# Patient Record
Sex: Female | Born: 1935 | Race: White | Hispanic: No | Smoking: Former smoker
Health system: Southern US, Community
[De-identification: ages and names within clinical notes are randomized; demographics above are authoritative.]

---

## 2006-09-07 ENCOUNTER — Ambulatory Visit (HOSPITAL_BASED_OUTPATIENT_CLINIC_OR_DEPARTMENT_OTHER): Admission: RE | Admit: 2006-09-07 | Discharge: 2006-09-07 | Payer: Self-pay | Admitting: Family Medicine

## 2006-09-09 ENCOUNTER — Ambulatory Visit: Payer: Self-pay | Admitting: Internal Medicine

## 2006-11-23 ENCOUNTER — Inpatient Hospital Stay (HOSPITAL_COMMUNITY): Admission: RE | Admit: 2006-11-23 | Discharge: 2006-11-25 | Payer: Self-pay | Admitting: Neurosurgery

## 2007-07-16 ENCOUNTER — Ambulatory Visit: Payer: Self-pay | Admitting: Orthopaedic Surgery

## 2009-02-15 ENCOUNTER — Ambulatory Visit: Payer: Self-pay | Admitting: Gastroenterology

## 2009-03-11 ENCOUNTER — Ambulatory Visit: Payer: Self-pay | Admitting: Gastroenterology

## 2009-05-26 ENCOUNTER — Encounter: Admission: RE | Admit: 2009-05-26 | Discharge: 2009-05-26 | Payer: Self-pay | Admitting: Neurosurgery

## 2009-05-26 IMAGING — RF IR MYELOGRAM [PERSON_NAME]
11 of 24 series · 11 of 24 positions shown · IV contrast (omnipaque)
Comparison: None available.

CLINICAL DATA: Cervical thoracic spondylosis.  Neck and upper
thoracic pain with radiation to both shoulders and the right hand.
Status post fusion at C5-6.

MYELOGRAM INJECTION
TECHNIQUE: Informed consent was obtained from the patient prior to
the procedure, including potential complications of headache,
allergy, infection and pain.  A timeout procedure was performed.
With the patient prone, the lower back was prepped with Betadine.
1% Lidocaine was used for local anesthesia.  Lumbar puncture was
performed at the right paramidline L2-3 level using a 22 gauge
needle with return of clear CSF.  10 ml of Omnipaque [UJ]
injected into the subarachnoid space .
TECHNIQUE: Following injection of intrathecal Omnipaque contrast,
spine imaging in multiple projections was performed using
fluoroscopy.
 Fluoroscopy Time: 3.09 minutes.
TECHNIQUE: CT imaging of the cervical spine was performed after
intrathecal contrast administration. Multiplanar CT image
reconstructions were also generated.
TECHNIQUE: CT imaging of the thoracic spine was performed after
intrathecal contrast administration.  Multiplanar CT image

[Series 2: myelogram  white · 1 of 1 slices shown (1 of 9)]
[im 1/1]
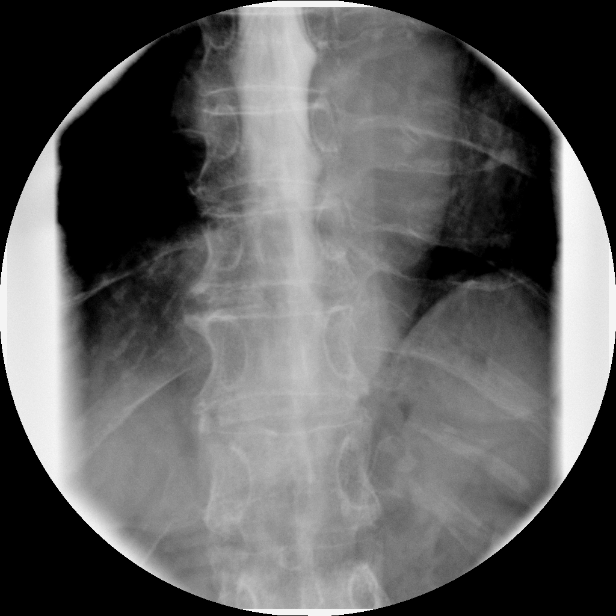

[Series 4: myelogram  white · 1 of 1 slices shown (2 of 9)]
[im 1/1]
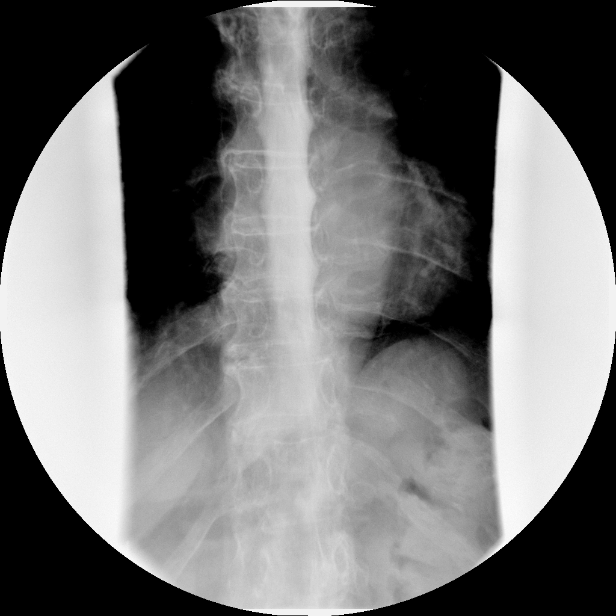

[Series 6: myelogram  white · 1 of 1 slices shown (3 of 9)]
[im 1/1]
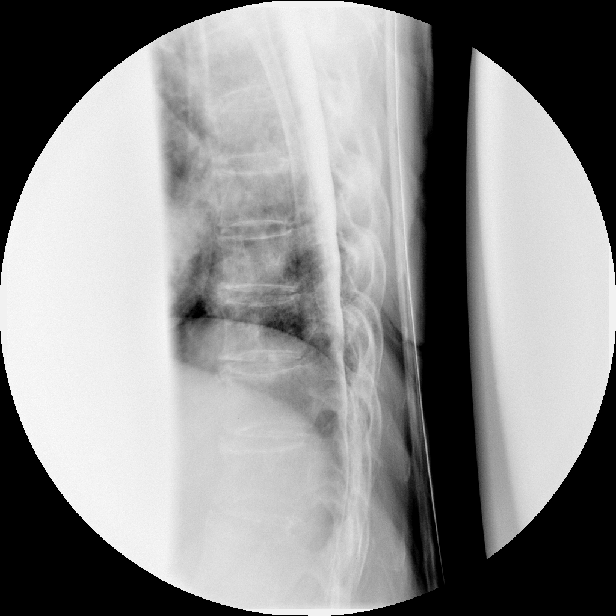

[Series 8: myelogram  white · 1 of 1 slices shown (4 of 9)]
[im 1/1]
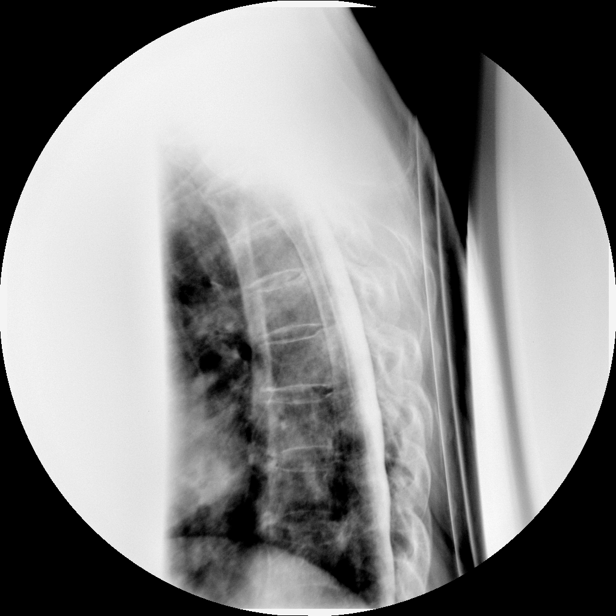

[Series 10: myelogram  white · 1 of 1 slices shown (5 of 9)]
[im 1/1]
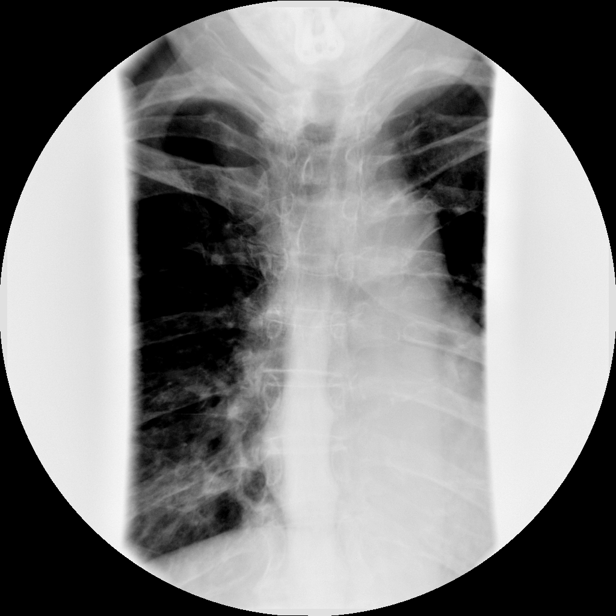

[Series 13: myelogram  white · 1 of 1 slices shown (6 of 9)]
[im 1/1]
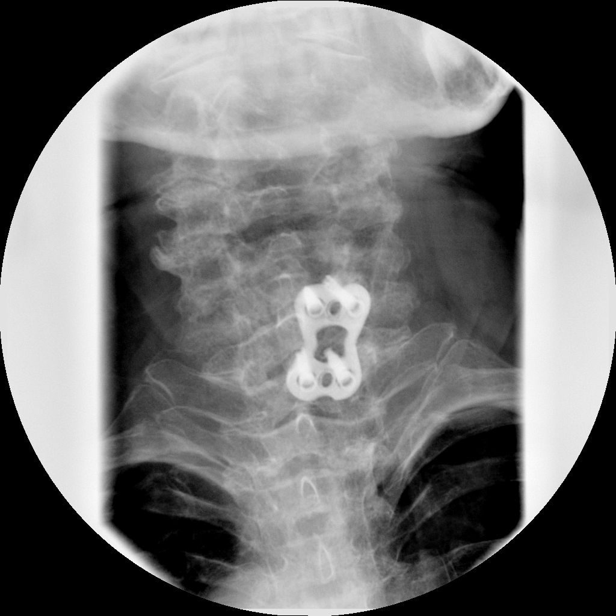

[Series 15: (hospital) · 1 of 1 slices shown (1 of 2)]
[im 1/1]
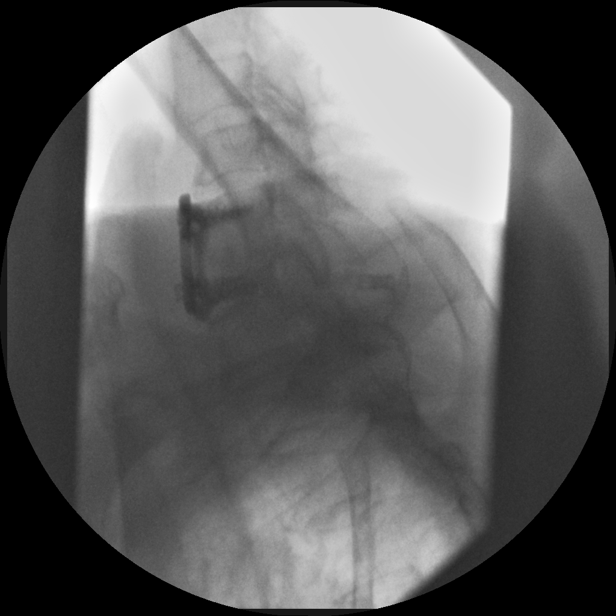

[Series 17: (hospital) · 1 of 1 slices shown (2 of 2)]
[im 1/1]
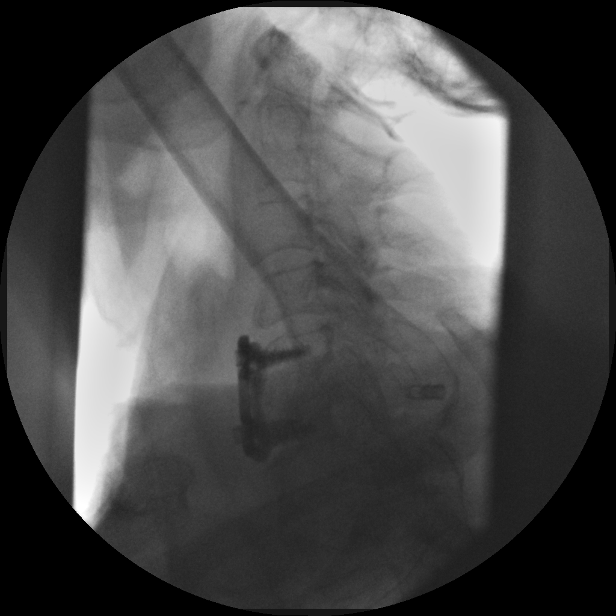

[Series 19: myelogram  white · 1 of 1 slices shown (7 of 9)]
[im 1/1]
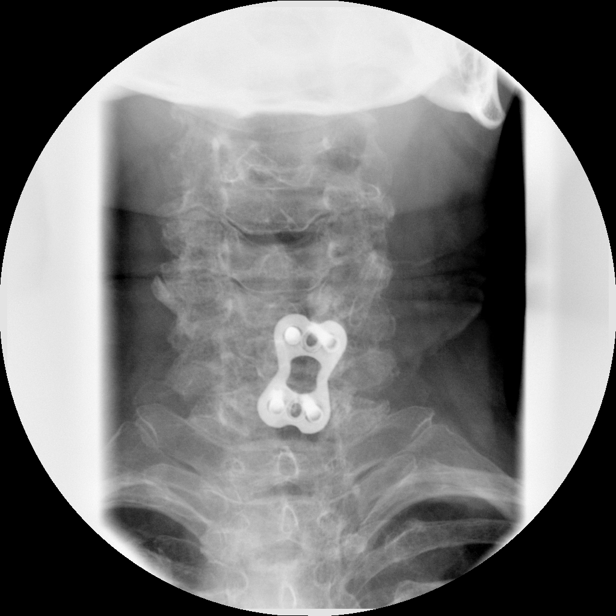

[Series 21: myelogram  white · 1 of 1 slices shown (8 of 9)]
[im 1/1]
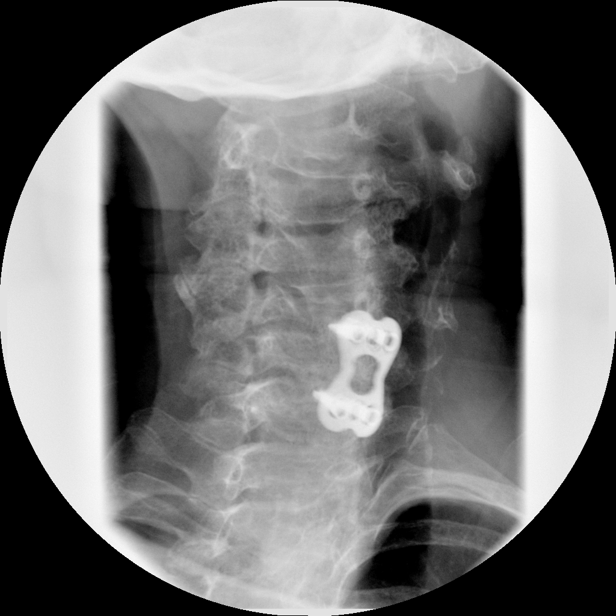

[Series 23: myelogram  white · 1 of 1 slices shown (9 of 9)]
[im 1/1]
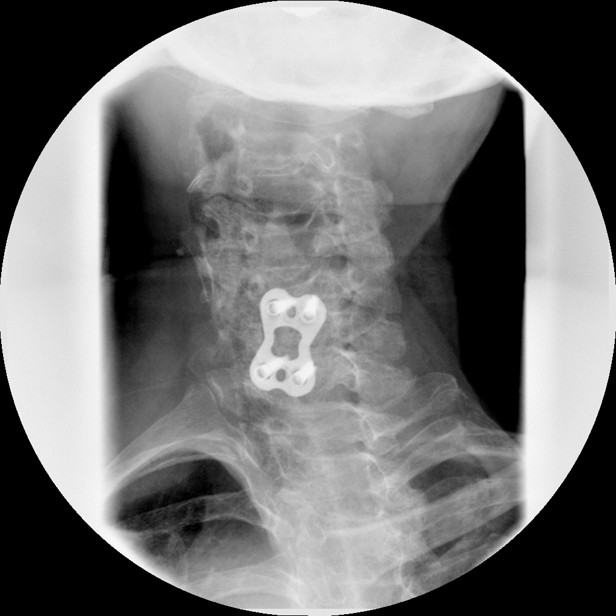

[11 of 24 positions shown; findings below may reference images not displayed]

IMPRESSION: Successful injection of  intrathecal contrast for myelography.

MYELOGRAM CERVICAL AND THORACIC
FINDINGS: Conventional myelographic images of the thoracic spine
demonstrate no focal disease.  The opacification of the cervical
spine is fairly faint.  The fusion at C5-6 appears mature.  A mild
disc osteophyte complex is present just above this level.  This is
seen better detail on the CT.
IMPRESSION: 1.  Small disc osteophyte complex at C4-5, just above the fused
level.
2.  The contrast is somewhat dilated to and the CT scan will be
utilized for the primary diagnosis in this case.

CT MYELOGRAPHY CERVICAL SPINE
FINDINGS: The patient is status post fusion at C5-6.  The right
inferior screw at C6 has some lucency about at.  Screw enters the
C6-7 disc space. Minimal degenerative anterolisthesis is present at
C4-5.  There is slight degenerative anterolisthesis at C7-T1 as
well.  The soft tissues are unremarkable.  There are several small
nodules within the thyroid gland bilaterally.  No significant
cervical adenopathy is present.  The mediastinum is within normal
limits.   Individual disc levels are as follows.

C1-2:  Mild left-sided hypertrophy is present to at the lateral
mass articulation.  There is no significant stenosis.

C2-3:  Mild left-sided uncovertebral disease and facet hypertrophy
is present.  There is no focal stenosis.

C3-4:  Advanced left-sided facet hypertrophy and uncovertebral
disease is present.  There is moderate left foraminal stenosis.
Moderate right facet hypertrophy is present with mild right
foraminal narrowing as well.

C4-5:  Advanced left-sided facet hypertrophy and uncovertebral
disease is present.  There is moderate left foraminal stenosis.
The right foramen is patent.  There is partial effacement of the
ventral CSF.  A mild soft disc bulging is present as well.

C5-6:  The patient is fused at this level.  Facet hypertrophy and
uncovertebral disease remain.  This results in mild right foraminal
narrowing. A right paracentral osteophyte partially effaces the
ventral CSF without clear contact of the cord.

C6-7:  Mild uncovertebral disease is present on the left.  There is
mild facet hypertrophy as well.  This results in mild left
foraminal narrowing.  The central canal is patent.

C7-T1:  Mild facet hypertrophy is present.  There is no significant
stenosis.
IMPRESSION: 1.  Status post fusion at C5-6 with persistent osteophyte
formation, partially effacing ventral CSF and mild right foraminal
narrowing at C5-6.
2. Left-sided uncovertebral and facet hypertrophy at C2-3 and C3-4.
3. Moderate left foraminal stenosis at C3-4.
4. Moderate left foraminal narrowing at C4-5 secondary to
asymmetric facet hypertrophy and uncovertebral disease as well as
mild soft disc bulging.
5.  Partial effacement of the ventral CSF at C4-5 without
significant central canal stenosis.

CT MYELOGRAPHY THORACIC SPINE
FINDINGS: Incidental note is made of an ill-defined fluid
collection just above the right kidney.  This is separate from the
adrenal glands.  The upper abdomen is otherwise unremarkable.  Mild
atherosclerotic changes are noted in the aorta without aneurysm.
The patient is status post cholecystectomy.  The vertebral body
heights are maintained.  AP alignment is anatomic.  Mild rightward
curvature centered at T7-8.  Individual disc levels are as follows.

T1-2:  There is slight calcification of the disc with partial
effacement of the ventral CSF.  No significant stenosis is present.

T2-3:  A tiny central osteophyte partially effaces the ventral CSF.
This does not clearly contact the cord.  There is no significant
stenosis.

T3-4:  There is some loss of disc height.  No significant stenosis
or focal herniation is present.  Asymmetric right-sided facet
hypertrophy is evident.  Note is made of fusion of the facets
bilaterally at T3-4.

T4-5:  Moderate facet degenerative changes are present.  There is
no significant disc herniation or focal stenosis.

T5-6:  Right greater than left facet hypertrophy is present.  There
is mild spurring from the right-sided facets.  No significant
stenosis is present.

T6-7:  There is no significant disc herniation or stenosis.

T7-8:  Negative.

T8-9:

T9-10:  Negative.

T10-11:  Mild facet hypertrophy is present.  There is no
significant stenosis. A superior endplate Schmorl's node is present
at T11.

T11-12:  Negative.

T12-L1:  Negative.
IMPRESSION: 1.  Small central osteophyte formation at T1-2 and T2-3 partially
effaces the ventral CSF.  This could contact the cord at T2-3 but
there is no significant cord distortion.
2.  Multilevel facet degenerative change in the upper thoracic
spine.  This may be an independent source of pain.
3.  There is fusion of the facet joints bilaterally at T3-4 and T4-
5.  There may be effusion on the right at T2-3 as well.

## 2009-06-21 ENCOUNTER — Encounter: Admission: RE | Admit: 2009-06-21 | Discharge: 2009-06-21 | Payer: Self-pay | Admitting: Neurosurgery

## 2009-08-11 ENCOUNTER — Encounter: Admission: RE | Admit: 2009-08-11 | Discharge: 2009-08-11 | Payer: Self-pay | Admitting: Neurosurgery

## 2010-12-23 NOTE — Procedures (Signed)
NAME:  Taylor Sullivan, Taylor Sullivan                 ACCOUNT NO.:  [id number]   MEDICAL RECORD NO.:  [id number]          PATIENT TYPE:  OUT   LOCATION:  SLEEP CENTER                 FACILITY:  [facility]   PHYSICIAN:  Clinton D. Maple Hudson, MD, FCCP, FACPDATE OF BIRTH:  12-10-31   DATE OF STUDY:                            NOCTURNAL POLYSOMNOGRAM   REFERRING PHYSICIAN:  Jeanmarie Plant, M.D.   INDICATION FOR STUDY:  Hypersomnia with sleep apnea.  Epworth sleepiness  6/24, BMI 22, weight 131 pounds.  Bedtime medication:  Tylenol PM,  Clonazepam.  Full medication list is reviewed.   SLEEP ARCHITECTURE:  Total sleep time 228 minutes with sleep deficiency  51%.  Stage I was 9%, stage II 78%, stages III and IV absent, REM 13% of  total sleep time.  Sleep latency 62 minutes, REM latency 348 minutes,  awake after sleep onset 158 minutes arousal index 21.   RESPIRATORY DATA:  Split study protocol.  Apnea/hypopnea index (AHI,  RDI) 22 obstructive events per hour indicating moderate obstructive  sleep apnea/hypopnea syndrome before CPAP.  There were eight obstructive  apnea, five central apneas, one mixed apnea, and 34 hypopneas before  CPAP.  Events were not positional.  REM AHI two per hour.  CPAP was  titrated to 7 CWP, AHI 0 per hour.  A small Snapp mask was used with  heated humidifier.   OXYGEN DATA:  Moderate snoring with oxygen desaturation to a nadir of  79%.  After CPAP control oxygen saturation held 92% on room air.   CARDIAC DATA:  Normal sinus rhythm.   MOVEMENT/PARASOMNIA:  Occasional limb jerk, insignificant.   IMPRESSION/RECOMMENDATION:  1. Moderate obstructive sleep apnea/hypopnea syndrome, AHI 22 per hour      with nonpositional events, moderate snoring, and oxygen      desaturation to a nadir of 79%.  2. Successful CPAP titration to 7 CWP, AHIs 0 per hour.  A small Snapp      mask was used with heated humidifier.      Clinton D. Maple Hudson, MD, [facility], FACP  Diplomate, Biomedical engineer of Sleep  Medicine  Electronically Signed     CDY/MEDQ  D:  09/09/2006 12:56:19  T:  09/09/2006 21:47:47  Job:  [id number]

## 2010-12-23 NOTE — Discharge Summary (Signed)
[name], [name]                 ACCOUNT NO.:  [id number]   MEDICAL RECORD NO.:  [id number]          PATIENT TYPE:  INP   LOCATION:  3002                         FACILITY:  [facility]   PHYSICIAN:  Stefani Dama, M.D.  DATE OF BIRTH:  01-05-1930   DATE OF ADMISSION:  11/23/2006  DATE OF DISCHARGE:  11/25/2006                               DISCHARGE SUMMARY   ADMITTING DIAGNOSES:  Herniated nucleus pulposus, C5-C6, with cervical  radiculopathy and spondylosis.   DISCHARGE DIAGNOSIS:  Same   CONDITION ON DISCHARGE:  Improving.   HOSPITAL COURSE:  Mrs. Taylor Sullivan is a 77 year old individual who had  been suffering with substantial neck pain and cervical radicular pain in  both upper extremities.  She has profound spondylitic disease, in  addition to a herniated nucleus pulposus at the C5-C6 level.  She  underwent surgical extirpation of the disk on 18th.  Drain was placed  during the postoperative period, and this was removed on the first 24-  hour postoperative period.  The patient tolerated this well, and her  swallowing is improved.  Pain control was good.  She has been advised  regarding her postoperative course, is given a prescription for Vicodin  #40 without refills.  She will be seen in the office in approximately 2  to 3 weeks' time.   CONDITION ON DISCHARGE:  Improving.      Stefani Dama, M.D.  Electronically Signed     HJE/MEDQ  D:  11/25/2006  T:  11/25/2006  Job:  [id number]

## 2010-12-23 NOTE — Op Note (Signed)
NAMEAILAH, BARNA                 ACCOUNT NO.:  [id number]   MEDICAL RECORD NO.:  [id number]          PATIENT TYPE:  INP   LOCATION:  3002                         FACILITY:  [facility]   PHYSICIAN:  Hilda Lias, M.D.   DATE OF BIRTH:  11/02/1929   DATE OF PROCEDURE:  11/23/2006  DATE OF DISCHARGE:                               OPERATIVE REPORT   PREOPERATIVE DIAGNOSIS:  C5-C6 stenosis, spondylosis, with  radiculopathy.   POSTOPERATIVE DIAGNOSIS:  C5-C6 stenosis, spondylosis, with  radiculopathy.   PROCEDURE:  Anterior C5-C6 discectomy, decompression of the spinal cord,  decompression of the C6 nerve root, interbody fusion with allograft and  plate, microscope.   SURGEON:  Hilda Lias, M.D.   ASSISTANT:  Stefani Dama, M.D.   CLINICAL HISTORY:  The patient was admitted because of neck pain with  radiation to both upper extremities associated with weakness of the  biceps.  X-ray shows severe degenerative disc disease at C5-C6.  Surgery  was advised. The risks were explained to her during the history and  physical.   DESCRIPTION OF PROCEDURE:  The patient was taken to the OR and after  intubation, the neck was cleaned with DuraPrep.  A transverse incision  through the skin, subcutaneous tissue, and platysma was done.  We found  easily the disc at the level of C6-C7 but at the level of C5-C6 was  quite difficult.  We had to take two x-rays just to be sure where we  were.  Anteriorly, the ligament was quite calcified and, indeed, there  was no space between C5 and C6 bone.  After we proved by x-ray that we  were at the right level, we drilled the anterior ligament and we entered  the disc space.  Then, using the drill as well as the 1 and 2 mm  Kerrison punch, we did a total discectomy.  The disc was quite  calcified.  Decompression of the spinal cord as well as the nerve root  was achieved.  Then, the endplate was drilled and allograft of 8 mm was  set in.  This was followed  by a plate using four screws.  Lateral  cervical spine showed good position of the bone graft and the plate.  A  drain was left in the cervical area and the wound was closed with Vicryl  and Steri-Strips.           ______________________________  Hilda Lias, M.D.     EB/MEDQ  D:  11/23/2006  T:  11/24/2006  Job:  [id number]

## 2012-02-19 ENCOUNTER — Ambulatory Visit: Payer: Medicare Other | Attending: Neurology | Admitting: Physical Therapy

## 2012-02-19 DIAGNOSIS — H8110 Benign paroxysmal vertigo, unspecified ear: Secondary | ICD-10-CM | POA: Insufficient documentation

## 2012-02-19 DIAGNOSIS — IMO0001 Reserved for inherently not codable concepts without codable children: Secondary | ICD-10-CM | POA: Insufficient documentation

## 2012-02-19 DIAGNOSIS — R269 Unspecified abnormalities of gait and mobility: Secondary | ICD-10-CM | POA: Insufficient documentation

## 2012-03-04 ENCOUNTER — Ambulatory Visit: Payer: Medicare Other | Admitting: Rehabilitative and Restorative Service Providers"

## 2012-03-18 ENCOUNTER — Ambulatory Visit: Payer: Medicare Other | Attending: Neurology | Admitting: Rehabilitative and Restorative Service Providers"

## 2012-03-18 DIAGNOSIS — H8110 Benign paroxysmal vertigo, unspecified ear: Secondary | ICD-10-CM | POA: Insufficient documentation

## 2012-03-18 DIAGNOSIS — R269 Unspecified abnormalities of gait and mobility: Secondary | ICD-10-CM | POA: Insufficient documentation

## 2012-03-18 DIAGNOSIS — IMO0001 Reserved for inherently not codable concepts without codable children: Secondary | ICD-10-CM | POA: Insufficient documentation

## 2013-10-29 ENCOUNTER — Ambulatory Visit: Payer: Self-pay | Admitting: Gastroenterology

## 2016-12-28 ENCOUNTER — Other Ambulatory Visit: Payer: Self-pay | Admitting: Gastroenterology

## 2016-12-28 DIAGNOSIS — R1310 Dysphagia, unspecified: Secondary | ICD-10-CM

## 2017-01-04 ENCOUNTER — Ambulatory Visit
Admission: RE | Admit: 2017-01-04 | Discharge: 2017-01-04 | Disposition: A | Discharge status: Home / Self Care | Payer: Medicare Other | Source: Ambulatory Visit | Attending: Gastroenterology | Admitting: Gastroenterology

## 2017-01-04 DIAGNOSIS — K228 Other specified diseases of esophagus: Secondary | ICD-10-CM | POA: Diagnosis not present

## 2017-01-04 DIAGNOSIS — K449 Diaphragmatic hernia without obstruction or gangrene: Secondary | ICD-10-CM | POA: Diagnosis not present

## 2017-01-04 DIAGNOSIS — R1310 Dysphagia, unspecified: Secondary | ICD-10-CM | POA: Diagnosis present

## 2017-02-27 ENCOUNTER — Encounter: Payer: Self-pay | Admitting: *Deleted

## 2017-02-28 ENCOUNTER — Ambulatory Visit: Payer: Medicare Other | Admitting: Anesthesiology

## 2017-02-28 ENCOUNTER — Encounter: Payer: Self-pay | Admitting: Anesthesiology

## 2017-02-28 ENCOUNTER — Encounter
Admission: RE | Disposition: A | Discharge status: Home / Self Care | Payer: Self-pay | Source: Ambulatory Visit | Attending: Unknown Physician Specialty

## 2017-02-28 ENCOUNTER — Ambulatory Visit
Admission: RE | Admit: 2017-02-28 | Discharge: 2017-02-28 | Disposition: A | Discharge status: Home / Self Care | Payer: Medicare Other | Source: Ambulatory Visit | Attending: Unknown Physician Specialty | Admitting: Unknown Physician Specialty

## 2017-02-28 DIAGNOSIS — R1310 Dysphagia, unspecified: Secondary | ICD-10-CM | POA: Diagnosis present

## 2017-02-28 DIAGNOSIS — I10 Essential (primary) hypertension: Secondary | ICD-10-CM | POA: Insufficient documentation

## 2017-02-28 DIAGNOSIS — Z888 Allergy status to other drugs, medicaments and biological substances status: Secondary | ICD-10-CM | POA: Insufficient documentation

## 2017-02-28 DIAGNOSIS — Z7982 Long term (current) use of aspirin: Secondary | ICD-10-CM | POA: Insufficient documentation

## 2017-02-28 DIAGNOSIS — G4730 Sleep apnea, unspecified: Secondary | ICD-10-CM | POA: Insufficient documentation

## 2017-02-28 DIAGNOSIS — Z87891 Personal history of nicotine dependence: Secondary | ICD-10-CM | POA: Insufficient documentation

## 2017-02-28 DIAGNOSIS — Z880 Allergy status to penicillin: Secondary | ICD-10-CM | POA: Diagnosis not present

## 2017-02-28 DIAGNOSIS — K311 Adult hypertrophic pyloric stenosis: Secondary | ICD-10-CM | POA: Insufficient documentation

## 2017-02-28 DIAGNOSIS — K219 Gastro-esophageal reflux disease without esophagitis: Secondary | ICD-10-CM | POA: Diagnosis not present

## 2017-02-28 DIAGNOSIS — R011 Cardiac murmur, unspecified: Secondary | ICD-10-CM | POA: Diagnosis not present

## 2017-02-28 DIAGNOSIS — K2270 Barrett's esophagus without dysplasia: Secondary | ICD-10-CM | POA: Insufficient documentation

## 2017-02-28 DIAGNOSIS — Z79899 Other long term (current) drug therapy: Secondary | ICD-10-CM | POA: Diagnosis not present

## 2017-02-28 HISTORY — PX: ESOPHAGOGASTRODUODENOSCOPY (EGD) WITH PROPOFOL: SHX5813

## 2017-02-28 SURGERY — ESOPHAGOGASTRODUODENOSCOPY (EGD) WITH PROPOFOL
Anesthesia: General

## 2017-02-28 MED ORDER — SODIUM CHLORIDE 0.9 % IV SOLN
INTRAVENOUS | Status: DC
  Administered 2017-02-28: 1000 mL via INTRAVENOUS

## 2017-02-28 MED ORDER — LIDOCAINE 2% (20 MG/ML) 5 ML SYRINGE
INTRAMUSCULAR | Status: DC | PRN
  Administered 2017-02-28: 40 mg via INTRAVENOUS

## 2017-02-28 MED ORDER — PROPOFOL 10 MG/ML IV BOLUS
INTRAVENOUS | Status: AC
  Filled 2017-02-28: qty 20

## 2017-02-28 MED ORDER — PROPOFOL 500 MG/50ML IV EMUL
INTRAVENOUS | Status: AC
  Filled 2017-02-28: qty 50

## 2017-02-28 MED ORDER — PROPOFOL 10 MG/ML IV BOLUS
INTRAVENOUS | Status: DC | PRN
  Administered 2017-02-28: 100 mg via INTRAVENOUS

## 2017-02-28 MED ORDER — LIDOCAINE HCL (PF) 2 % IJ SOLN
INTRAMUSCULAR | Status: AC
  Filled 2017-02-28: qty 2

## 2017-02-28 MED ORDER — PROPOFOL 500 MG/50ML IV EMUL
INTRAVENOUS | Status: DC | PRN
  Administered 2017-02-28: 140 ug/kg/min via INTRAVENOUS

## 2017-02-28 MED ORDER — SODIUM CHLORIDE 0.9 % IV SOLN
INTRAVENOUS | Status: DC
  Administered 2017-02-28: 13:00:00 via INTRAVENOUS

## 2017-02-28 MED ORDER — FENTANYL CITRATE (PF) 100 MCG/2ML IJ SOLN
INTRAMUSCULAR | Status: DC | PRN
  Administered 2017-02-28: 50 ug via INTRAVENOUS

## 2017-02-28 MED ORDER — FENTANYL CITRATE (PF) 100 MCG/2ML IJ SOLN
INTRAMUSCULAR | Status: AC
  Filled 2017-02-28: qty 2

## 2017-02-28 MED ORDER — SODIUM CHLORIDE 0.9 % IV SOLN: INTRAVENOUS | Status: DC

## 2017-02-28 NOTE — Op Note (Signed)
[facility] Gastroenterology Patient Name: Taylor Sullivan Procedure Date: 02/28/2017 1:02 PM MRN: [id number] Account #: [id number] Date of Birth: November 05, 1929 Admit Type: Outpatient Age: [age] Room: [facility] ENDO ROOM 3 Gender: Female Note Status: Finalized Procedure:            Upper GI endoscopy Indications:          Dysphagia Providers:            Scot Junobert T. Neita Landrigan, MD Referring MD:         Courtney ParisElizabeth B. White, MD (Referring MD) Medicines:            Propofol per Anesthesia Complications:        No immediate complications. Procedure:            Pre-Anesthesia Assessment:                       - After reviewing the risks and benefits, the patient                        was deemed in satisfactory condition to undergo the                        procedure.                       After obtaining informed consent, the endoscope was                        passed under direct vision. Throughout the procedure,                        the patient's blood pressure, pulse, and oxygen                        saturations were monitored continuously. The Endoscope                        was introduced through the mouth, and advanced to the                        duodenal bulb. The upper GI endoscopy was accomplished                        without difficulty. The patient tolerated the procedure                        well. Findings:      There were esophageal mucosal changes consistent with short-segment       Barrett's esophagus present in the lower third of the esophagus. The       maximum longitudinal extent of these mucosal changes was 1-2 cm in       length. At the end of the procedure I passed a guide wire into the       stomach and withdrew the scope and passed a 72F Savary dilator with       miniimal resistance.      A small amount of food (residue) was found in the gastric body.      A benign-appearing, intrinsic mild stenosis was found in the prepyloric       region of the  stomach. This was not traversed until the  dilator was       used. A TTS dilator was passed through the scope. Dilation with a       05-18-11 mm pyloric balloon dilator was performed. Only dilated up to       10mm.      The examined duodenum was normal. Impression:           - Esophageal mucosal changes consistent with                        short-segment Barrett's esophagus.                       - A small amount of food (residue) in the stomach.                       - Gastric stenosis was found in the prepyloric region                        of the stomach. Dilated.                       - Normal examined duodenum.                       - No specimens collected. Recommendation:       - soft food for 3 days, eat slowly, chew well, take                        small bites Scot Junobert T Abdo Denault, MD 02/28/2017 1:27:36 PM This report has been signed electronically. Number of Addenda: 0 Note Initiated On: 02/28/2017 1:02 PM      [facility]

## 2017-02-28 NOTE — Transfer of Care (Signed)
Immediate Anesthesia Transfer of Care Note  Patient: Taylor CreekRuth M Lisenbee  Procedure(s) Performed: Procedure(s): ESOPHAGOGASTRODUODENOSCOPY (EGD) WITH PROPOFOL (N/A)  Patient Location: PACU and Endoscopy Unit  Anesthesia Type:General  Level of Consciousness: sedated  Airway & Oxygen Therapy: Patient Spontanous Breathing and Patient connected to nasal cannula oxygen  Post-op Assessment: Report given to RN and Post -op Vital signs reviewed and stable  Post vital signs: Reviewed and stable  Last Vitals:  Vitals:   02/28/17 1144  BP: (!) 152/75  Pulse: 67  Resp: 16  Temp: (!) 35.7 C    Last Pain:  Vitals:   02/28/17 1144  TempSrc: Tympanic  PainSc: 7          Complications: No apparent anesthesia complications

## 2017-02-28 NOTE — Anesthesia Post-op Follow-up Note (Cosign Needed)
Anesthesia QCDR form completed.        

## 2017-02-28 NOTE — H&P (Signed)
Primary Care Physician:  Chilton GreathouseSmith, Donna C, NP Primary Gastroenterologist:  Dr. Mechele CollinElliott  Pre-Procedure History & Physical: HPI:  Taylor Sullivan is a 83 y.o. female is here for an endoscopy.   History reviewed. No pertinent past medical history.  History reviewed. No pertinent surgical history.  Prior to Admission medications   Medication Sig Start Date End Date Taking? Authorizing Provider  amLODipine-benazepril (LOTREL) 5-20 MG capsule Take 1 capsule by mouth daily.   Yes [provider]  aspirin EC 81 MG tablet Take 81 mg by mouth daily.   Yes [provider]  atorvastatin (LIPITOR) 10 MG tablet Take 10 mg by mouth daily.   Yes [provider]  calcium carbonate (OS-CAL - DOSED IN MG OF ELEMENTAL CALCIUM) 1250 (500 Ca) MG tablet Take 1 tablet by mouth 2 (two) times daily with a meal.   Yes [provider]  cetirizine (ZYRTEC) 10 MG tablet Take 10 mg by mouth daily.   Yes [provider]  clonazePAM (KLONOPIN) 1 MG tablet Take 1 mg by mouth daily.   Yes [provider]  DULoxetine (CYMBALTA) 30 MG capsule Take 30 mg by mouth daily.   Yes [provider]  meclizine (ANTIVERT) 25 MG tablet Take 25 mg by mouth 3 (three) times daily as needed for dizziness.   Yes [provider]  Multiple Vitamins-Iron (MULTIVITAMINS WITH IRON) TABS tablet Take 1 tablet by mouth daily.   Yes [provider]  omeprazole (PRILOSEC) 20 MG capsule Take 20 mg by mouth 2 (two) times daily before a meal.   Yes [provider]  clindamycin (CLEOCIN) 150 MG capsule Take by mouth as directed.    [provider]  naproxen (NAPROSYN) 500 MG tablet Take 500 mg by mouth 2 (two) times daily with a meal.    [provider]    Allergies as of 01/09/2017  . (Not on File)    History reviewed. No pertinent family history.  Social History   Social History  . Marital status: Widowed    Spouse name: N/A  . Number of  children: N/A  . Years of education: N/A   Occupational History  . Not on file.   Social History Main Topics  . Smoking status: Former Games developermoker  . Smokeless tobacco: Never Used  . Alcohol use No  . Drug use: No  . Sexual activity: Not on file   Other Topics Concern  . Not on file   Social History Narrative  . No narrative on file    Review of Systems: See HPI, otherwise negative ROS  Physical Exam: BP (!) 152/75   Pulse 67   Temp (!) 96.2 F (35.7 C) (Tympanic)   Resp 16   Ht 5' 4.5" (1.638 m)   Wt 57.2 kg (126 lb)   SpO2 98%   BMI 21.29 kg/m  General:   Alert,  pleasant and cooperative in NAD Head:  Normocephalic and atraumatic. Neck:  Supple; no masses or thyromegaly. Lungs:  Clear throughout to auscultation.    Heart:  Regular rate and rhythm. Abdomen:  Soft, nontender and nondistended. Normal bowel sounds, without guarding, and without rebound.   Neurologic:  Alert and  oriented x4;  grossly normal neurologically.  Impression/Plan: Taylor Sullivan is here for an endoscopy to be performed for dysphagia.    Risks, benefits, limitations, and alternatives regarding  endoscopy have been reviewed with the patient.  Questions have been answered.  All parties agreeable.  Lynnae PrudeELLIOTT, ROBERT, MD  02/28/2017, 1:07 PM

## 2017-02-28 NOTE — Anesthesia Preprocedure Evaluation (Signed)
Anesthesia Evaluation  Patient identified by MRN, date of birth, ID band Patient awake    Reviewed: Allergy & Precautions, H&P , NPO status , Patient's Chart, lab work & pertinent test results, reviewed documented beta blocker date and time   History of Anesthesia Complications Negative for: history of anesthetic complications  Airway Mallampati: I  TM Distance: >3 FB Neck ROM: full    Dental  (+) Dental Advidsory Given, Caps   Pulmonary neg pulmonary ROS, neg shortness of breath, sleep apnea and Continuous Positive Airway Pressure Ventilation , neg COPD, neg recent URI, former smoker,           Cardiovascular Exercise Tolerance: Good hypertension, On Medications (-) angina(-) CAD, (-) Past MI, (-) Cardiac Stents and (-) CABG (-) dysrhythmias + Valvular Problems/Murmurs      Neuro/Psych negative neurological ROS  negative psych ROS   GI/Hepatic Neg liver ROS, GERD  ,  Endo/Other  negative endocrine ROS  Renal/GU negative Renal ROS  negative genitourinary   Musculoskeletal   Abdominal   Peds  Hematology negative hematology ROS (+)   Anesthesia Other Findings History reviewed. No pertinent past medical history.   Reproductive/Obstetrics negative OB ROS                             Anesthesia Physical Anesthesia Plan  ASA: II  Anesthesia Plan: General   Post-op Pain Management:    Induction: Intravenous  PONV Risk Score and Plan: 3 and Propofol  Airway Management Planned: Natural Airway and Nasal Cannula  Additional Equipment:   Intra-op Plan:   Post-operative Plan:   Informed Consent: I have reviewed the patients History and Physical, chart, labs and discussed the procedure including the risks, benefits and alternatives for the proposed anesthesia with the patient or authorized representative who has indicated his/her understanding and acceptance.   Dental Advisory  Given  Plan Discussed with: Anesthesiologist, CRNA and Surgeon  Anesthesia Plan Comments:         Anesthesia Quick Evaluation

## 2017-03-01 ENCOUNTER — Encounter: Payer: Self-pay | Admitting: Unknown Physician Specialty

## 2017-03-01 NOTE — Anesthesia Postprocedure Evaluation (Signed)
Anesthesia Post Note  Patient: Taylor CreekRuth M Keirsey  Procedure(s) Performed: Procedure(s) (LRB): ESOPHAGOGASTRODUODENOSCOPY (EGD) WITH PROPOFOL (N/A)  Patient location during evaluation: Endoscopy Anesthesia Type: General Level of consciousness: awake and alert Pain management: pain level controlled Vital Signs Assessment: post-procedure vital signs reviewed and stable Respiratory status: spontaneous breathing, nonlabored ventilation, respiratory function stable and patient connected to nasal cannula oxygen Cardiovascular status: blood pressure returned to baseline and stable Postop Assessment: no signs of nausea or vomiting Anesthetic complications: no     Last Vitals:  Vitals:   02/28/17 1327 02/28/17 1426  BP: (!) 112/53 124/62  Pulse:  71  Resp: 16 16  Temp: (!) 36.1 C     Last Pain:  Vitals:   03/01/17 0722  TempSrc:   PainSc: 0-No pain                 Lenard SimmerAndrew Xee Hollman

## 2020-03-17 ENCOUNTER — Encounter: Payer: Self-pay | Admitting: Genetic Counselor

## 2021-01-24 ENCOUNTER — Inpatient Hospital Stay
Admission: AD | Admit: 2021-01-24 | Payer: PRIVATE HEALTH INSURANCE | Source: Other Acute Inpatient Hospital | Admitting: Pulmonary Disease
